# Patient Record
Sex: Female | Born: 1960 | Race: White | Hispanic: Yes | Marital: Single | Smoking: Former smoker
Health system: Southern US, Community
[De-identification: ages and names within clinical notes are randomized; demographics above are authoritative.]

## PROBLEM LIST (undated history)

## (undated) DIAGNOSIS — M329 Systemic lupus erythematosus, unspecified: Secondary | ICD-10-CM

## (undated) DIAGNOSIS — E119 Type 2 diabetes mellitus without complications: Secondary | ICD-10-CM

## (undated) DIAGNOSIS — E669 Obesity, unspecified: Secondary | ICD-10-CM

## (undated) DIAGNOSIS — I10 Essential (primary) hypertension: Secondary | ICD-10-CM

---

## 2006-07-16 ENCOUNTER — Ambulatory Visit: Payer: Self-pay | Admitting: Family Medicine

## 2008-08-31 ENCOUNTER — Ambulatory Visit: Payer: Self-pay | Admitting: Medical

## 2010-08-26 ENCOUNTER — Ambulatory Visit: Payer: Self-pay | Admitting: Family

## 2013-06-03 ENCOUNTER — Emergency Department: Payer: Self-pay | Admitting: Emergency Medicine

## 2013-06-03 LAB — COMPREHENSIVE METABOLIC PANEL
ALBUMIN: 3.1 g/dL — AB (ref 3.4–5.0)
ALK PHOS: 131 U/L — AB
ANION GAP: 6 — AB (ref 7–16)
AST: 39 U/L — AB (ref 15–37)
BILIRUBIN TOTAL: 0.3 mg/dL (ref 0.2–1.0)
BUN: 12 mg/dL (ref 7–18)
Calcium, Total: 8.7 mg/dL (ref 8.5–10.1)
Chloride: 106 mmol/L (ref 98–107)
Co2: 27 mmol/L (ref 21–32)
Creatinine: 0.9 mg/dL (ref 0.60–1.30)
EGFR (African American): >60
EGFR (Non-African Amer.): >60
Glucose: 164 mg/dL — ABNORMAL HIGH (ref 65–99)
OSMOLALITY: 281 (ref 275–301)
Potassium: 3.9 mmol/L (ref 3.5–5.1)
SGPT (ALT): 73 U/L (ref 12–78)
SODIUM: 139 mmol/L (ref 136–145)
Total Protein: 6.8 g/dL (ref 6.4–8.2)

## 2013-06-03 LAB — CBC
HCT: 39.3 % (ref 35.0–47.0)
HGB: 13.4 g/dL (ref 12.0–16.0)
MCH: 31.4 pg (ref 26.0–34.0)
MCHC: 34 g/dL (ref 32.0–36.0)
MCV: 92 fL (ref 80–100)
Platelet: 189 10*3/uL (ref 150–440)
RBC: 4.26 10*6/uL (ref 3.80–5.20)
RDW: 13.4 % (ref 11.5–14.5)
WBC: 5 10*3/uL (ref 3.6–11.0)

## 2013-06-03 LAB — TROPONIN I: Troponin-I: <0.02 ng/mL

## 2013-11-04 ENCOUNTER — Emergency Department: Payer: Self-pay | Admitting: Emergency Medicine

## 2014-09-29 ENCOUNTER — Other Ambulatory Visit: Payer: Self-pay | Admitting: Primary Care

## 2014-09-29 DIAGNOSIS — Z1231 Encounter for screening mammogram for malignant neoplasm of breast: Secondary | ICD-10-CM

## 2014-10-09 ENCOUNTER — Ambulatory Visit
Admission: RE | Admit: 2014-10-09 | Discharge: 2014-10-09 | Disposition: A | Discharge status: Home / Self Care | Payer: Self-pay | Source: Ambulatory Visit | Attending: Primary Care | Admitting: Primary Care

## 2014-10-09 DIAGNOSIS — Z1231 Encounter for screening mammogram for malignant neoplasm of breast: Secondary | ICD-10-CM

## 2014-10-14 ENCOUNTER — Other Ambulatory Visit: Payer: Self-pay | Admitting: Primary Care

## 2014-10-14 DIAGNOSIS — R928 Other abnormal and inconclusive findings on diagnostic imaging of breast: Secondary | ICD-10-CM

## 2014-10-20 ENCOUNTER — Ambulatory Visit
Admission: RE | Admit: 2014-10-20 | Discharge: 2014-10-20 | Disposition: A | Discharge status: Home / Self Care | Payer: Self-pay | Source: Ambulatory Visit | Attending: Primary Care | Admitting: Primary Care

## 2014-10-20 DIAGNOSIS — R928 Other abnormal and inconclusive findings on diagnostic imaging of breast: Secondary | ICD-10-CM

## 2014-10-20 DIAGNOSIS — N63 Unspecified lump in breast: Secondary | ICD-10-CM | POA: Insufficient documentation

## 2014-11-09 ENCOUNTER — Encounter: Payer: Self-pay | Admitting: *Deleted

## 2014-11-09 ENCOUNTER — Ambulatory Visit
Admission: RE | Admit: 2014-11-09 | Discharge: 2014-11-09 | Disposition: A | Discharge status: Home / Self Care | Payer: Self-pay | Source: Ambulatory Visit | Attending: Oncology | Admitting: Oncology

## 2014-11-09 ENCOUNTER — Ambulatory Visit: Payer: Self-pay | Attending: Oncology | Admitting: *Deleted

## 2014-11-09 VITALS — BP 130/84 | HR 84 | Temp 98.0°F | Resp 18 | Ht 68.11 in | Wt 269.8 lb

## 2014-11-09 DIAGNOSIS — N63 Unspecified lump in unspecified breast: Secondary | ICD-10-CM

## 2014-11-09 NOTE — Progress Notes (Signed)
Subjective:     Patient ID: Jennifer Bonilla, female   DOB: 05/04/1960, 54 y.o.   MRN: [id number]  HPI   Review of Systems     Objective:   Physical Exam  Pulmonary/Chest: Right breast exhibits tenderness. Right breast exhibits no inverted nipple, no mass, no nipple discharge and no skin change. Left breast exhibits tenderness. Left breast exhibits no inverted nipple, no mass, no nipple discharge and no skin change. Breasts are symmetrical.       Assessment:     54 year old Hispanic female [facility] for further evaluation of a right breast cyst.  Patient received a free mammogram via our [employer] on 10/20/14.  Results was a birads 3, noting a complicated right breast cyst and a left breast mass.  Radiologist recommended a right breast cyst aspiration.  Jennifer Bonilla, the interpreter present during the interview and exam.  Clinical breast with notable striae.  Bilateral breast are tender to palpation.  States she drinks one cup of coffee a day, and no other caffeine products.  Taught self breast awareness.  Patient has been screened for eligibility.  She does not have any insurance, Medicare or Medicaid.  She also meets financial eligibility.  Hand-out given on the Affordable Care Act.    Plan:      Order placed for a right breast ultrasound guided cyst aspiration.  Will schedule appropriate follow-up appointment when pathology is available.  Follow-up per protocol.

## 2014-11-11 ENCOUNTER — Ambulatory Visit: Payer: Self-pay

## 2014-11-25 ENCOUNTER — Encounter: Payer: Self-pay | Admitting: *Deleted

## 2014-11-25 NOTE — Progress Notes (Signed)
FNA completed on 11/09/14 with scant fluid removed.  No further work-up per radiology.  To follow-up in 1 year.

## 2015-10-20 ENCOUNTER — Encounter: Payer: Self-pay | Admitting: *Deleted

## 2015-10-20 ENCOUNTER — Ambulatory Visit: Payer: Self-pay | Attending: Internal Medicine | Admitting: *Deleted

## 2015-10-20 VITALS — BP 121/74 | HR 76 | Temp 97.2°F | Ht 67.72 in | Wt 277.2 lb

## 2015-10-20 DIAGNOSIS — N63 Unspecified lump in unspecified breast: Secondary | ICD-10-CM

## 2015-10-20 NOTE — Patient Instructions (Signed)
Gave patient hand-out, Women Staying Healthy, Active and Well from [facility], with education on breast health, pap smears, heart and colon health. 

## 2015-10-20 NOTE — Progress Notes (Signed)
Subjective:     Patient ID: Delane GingerMaria CRISTINA Grable, female   DOB: 08/16/1960, 55 y.o.   MRN: [id number]  HPI   Review of Systems     Objective:   Physical Exam  Pulmonary/Chest: Right breast exhibits tenderness. Right breast exhibits no inverted nipple, no mass, no nipple discharge and no skin change. Left breast exhibits tenderness. Left breast exhibits no inverted nipple, no mass, no nipple discharge and no skin change.    Right areola larger than the left       Assessment:     55 year old [place] [facility].  Had benign FNA of the left breast 2016.  Needs follow up of the right breast nodule from 2016.  Patient complains of targeted bilateral breast pain.  On clinical breast exam there is no dominant mass, skin changes or lymphadenopathy.  States the pain has been present on the right for about 4 months.  States she cannot sleep on her right side.  Does take Tylenol occasionally for the pain with good relief.  Taught self breast awareness.  Patient has been screened for eligibility.  She does not have any insurance, Medicare or Medicaid.  She also meets financial eligibility.  Hand-out given on the Affordable Care Act.     Plan:     Bilateral diagnostic mammogram ordered with ultrasound.  Will follow-up per protocol.

## 2015-10-29 ENCOUNTER — Ambulatory Visit
Admission: RE | Admit: 2015-10-29 | Discharge: 2015-10-29 | Disposition: A | Discharge status: Home / Self Care | Payer: Self-pay | Source: Ambulatory Visit | Attending: Internal Medicine | Admitting: Internal Medicine

## 2015-10-29 DIAGNOSIS — N63 Unspecified lump in unspecified breast: Secondary | ICD-10-CM

## 2015-11-11 ENCOUNTER — Telehealth: Payer: Self-pay | Admitting: *Deleted

## 2015-11-11 NOTE — Telephone Encounter (Signed)
Called patient via the [place] [phone] the language line interpreter.  Did reach the patient, but she stated she could not talk to call her back at 12:00.   I need to discuss her mammogram results and reassess her breast pain. Will try again later.

## 2015-12-10 ENCOUNTER — Telehealth: Payer: Self-pay | Admitting: *Deleted

## 2015-12-10 NOTE — Telephone Encounter (Signed)
Called patient via Reuel BoomDaniel, #[phone] the [employer] T[employer]interpreter,via [employer].  Patient would not talk with me.  Tried to ask if her breast pain had resolved.  States she didn't know what I was talking about.  Patient stated she would call me back.  This has been the second attempt to talk with the patient.  She had a normal mammogram, and her initial breast pain was relieved by Tylenol.  If patient returns my call I will offer further evaluation if pain persist, otherwise will go ahead and close case.  [facility] to [place].

## 2018-08-26 ENCOUNTER — Ambulatory Visit: Payer: Self-pay

## 2019-04-06 ENCOUNTER — Emergency Department: Payer: Self-pay

## 2019-04-06 ENCOUNTER — Other Ambulatory Visit: Payer: Self-pay

## 2019-04-06 ENCOUNTER — Emergency Department
Admission: EM | Admit: 2019-04-06 | Discharge: 2019-04-06 | Disposition: A | Discharge status: Home / Self Care | Payer: Self-pay | Attending: Emergency Medicine | Admitting: Emergency Medicine

## 2019-04-06 ENCOUNTER — Encounter: Payer: Self-pay | Admitting: Intensive Care

## 2019-04-06 DIAGNOSIS — M545 Low back pain, unspecified: Secondary | ICD-10-CM

## 2019-04-06 DIAGNOSIS — Z87891 Personal history of nicotine dependence: Secondary | ICD-10-CM | POA: Insufficient documentation

## 2019-04-06 DIAGNOSIS — E119 Type 2 diabetes mellitus without complications: Secondary | ICD-10-CM | POA: Insufficient documentation

## 2019-04-06 DIAGNOSIS — Z7984 Long term (current) use of oral hypoglycemic drugs: Secondary | ICD-10-CM | POA: Insufficient documentation

## 2019-04-06 DIAGNOSIS — I10 Essential (primary) hypertension: Secondary | ICD-10-CM | POA: Insufficient documentation

## 2019-04-06 HISTORY — DX: Essential (primary) hypertension: I10

## 2019-04-06 HISTORY — DX: Type 2 diabetes mellitus without complications: E11.9

## 2019-04-06 HISTORY — DX: Systemic lupus erythematosus, unspecified: M32.9

## 2019-04-06 HISTORY — DX: Obesity, unspecified: E66.9

## 2019-04-06 MED ORDER — METFORMIN HCL 1000 MG PO TABS: 1000.0000 mg | ORAL_TABLET | Freq: Two times a day (BID) | ORAL | 0 refills | Status: AC

## 2019-04-06 MED ORDER — IBUPROFEN 800 MG PO TABS
800.0000 mg | ORAL_TABLET | Freq: Once | ORAL | Status: AC
  Administered 2019-04-06: 800 mg via ORAL
  Filled 2019-04-06: qty 1

## 2019-04-06 MED ORDER — TRAMADOL HCL 50 MG PO TABS: 50.0000 mg | ORAL_TABLET | Freq: Three times a day (TID) | ORAL | 0 refills | Status: AC | PRN

## 2019-04-06 MED ORDER — METHOCARBAMOL 500 MG PO TABS: 500.0000 mg | ORAL_TABLET | Freq: Three times a day (TID) | ORAL | 0 refills | Status: AC | PRN

## 2019-04-06 MED ORDER — TRAMADOL HCL 50 MG PO TABS
50.0000 mg | ORAL_TABLET | Freq: Once | ORAL | Status: AC
  Administered 2019-04-06: 50 mg via ORAL
  Filled 2019-04-06: qty 1

## 2019-04-06 MED ORDER — CYCLOBENZAPRINE HCL 10 MG PO TABS
10.0000 mg | ORAL_TABLET | Freq: Once | ORAL | Status: AC
  Administered 2019-04-06: 10 mg via ORAL
  Filled 2019-04-06: qty 1

## 2019-04-06 NOTE — ED Notes (Signed)
First Nurse Note: Pt to ED via POV c/o back pain. Pt is in NAD at this time.

## 2019-04-06 NOTE — ED Triage Notes (Signed)
Patient c/o lower back pain. Denies urinary symptoms. Reports some nausea

## 2019-04-06 NOTE — ED Provider Notes (Signed)
[facility] Emergency Department Provider Note ____________________________________________  Time seen: 1915  I have reviewed the triage vital signs and the nursing notes.  HISTORY  Chief Complaint  Back Pain   HPI Jennifer Bonilla is a 58 y.o. female presents to the ER today with complaint of left lower back pain.  She reports this started 3 to 4 days ago.  She describes the pain as sharp and stabbing.  The pain radiates into her left buttock but she denies pain that radiates down her legs.  She denies numbness, tingling, weakness or loss of bowel or bladder control.  She denies any injury to the area.  She has taken Tylenol OTC with minimal relief.  She denies prior back surgery.  She would also like a refill of her diabetic medication today. She has an appt on 12/20 with her PCP. Past Medical History:  Diagnosis Date  . Diabetes mellitus without complication ([place])   . Hypertension   . Obese   . Systemic lupus ([place])     There are no active problems to display for this patient.   History reviewed. No pertinent surgical history.  Prior to Admission medications   Medication Sig Start Date End Date Taking? Authorizing Provider  metFORMIN (GLUCOPHAGE) 1000 MG tablet Take 1 tablet (1,000 mg total) by mouth 2 (two) times daily with a meal. 04/06/19 04/05/20  Jearld Fenton, NP  methocarbamol (ROBAXIN) 500 MG tablet Take 1 tablet (500 mg total) by mouth every 8 (eight) hours as needed for muscle spasms. 04/06/19   Jearld Fenton, NP  traMADol (ULTRAM) 50 MG tablet Take 1 tablet (50 mg total) by mouth every 8 (eight) hours as needed. 04/06/19 04/05/20  Jearld Fenton, NP    Allergies Patient has no known allergies.  History reviewed. No pertinent family history.  Social History Social History   Tobacco Use  . Smoking status: Former Smoker    Packs/day: 1.00    Years: 10.00    Pack years: 10.00    Types: Cigarettes    Quit date: 11/08/1993    Years  since quitting: 25.4  . Smokeless tobacco: Never Used  Substance Use Topics  . Alcohol use: Yes    Alcohol/week: 0.0 standard drinks    Comment: rare  . Drug use: Never    Review of Systems  Constitutional: Negative for fever, chills or body aches. Cardiovascular: Negative for chest pain or chest tightness. Respiratory: Negative for cough or shortness of breath. Gastrointestinal: Negative for loss of bowel control. Genitourinary: Negative for loss of bladder control. Musculoskeletal: Positive for left lower back pain.  Negative for hip pain. Skin: Negative for rash. Neurological: Negative for focal weakness, tingling or numbness. ____________________________________________  PHYSICAL EXAM:  VITAL SIGNS: ED Triage Vitals  Enc Vitals Group     BP 04/06/19 1824 (!) 174/80     Pulse Rate 04/06/19 1824 77     Resp 04/06/19 1824 18     Temp 04/06/19 1824 98.1 F (36.7 C)     Temp Source 04/06/19 1824 Oral     SpO2 04/06/19 1824 99 %     Weight 04/06/19 1834 263 lb (119.3 kg)     Height 04/06/19 1834 5' 3.78" (1.62 m)     Head Circumference --      Peak Flow --      Pain Score 04/06/19 1833 6     Pain Loc --      Pain Edu? --  Excl. in GC? --     Constitutional: Alert and oriented.  Obese but in no distress. Cardiovascular: Normal rate, regular rhythm.  Pedal pulses 2+ bilaterally. Respiratory: Normal respiratory effort. No wheezes/rales/rhonchi. Gastrointestinal: Soft and nontender. No CVA tenderness. Musculoskeletal: Normal flexion, extension and rotation of the spine.  No bony tenderness noted over the spine.  Pain with palpation of the left paralumbar muscles.  Strength 5/5 BLE.  No difficulty with gait. Neurologic:   Normal speech and language. No gross focal neurologic deficits are appreciated. Skin:  Skin is warm, dry and intact.  ____________________________________________   RADIOLOGY  Imaging Orders     DG Lumbar Spine 2-3 Views IMPRESSION:  No acute  abnormality detected.    ____________________________________________    INITIAL IMPRESSION / ASSESSMENT AND PLAN / ED COURSE  Acute Left Sided Low Back Pain:  Xray negative Ibuprofen 800 mg PO x 1 Flexeril 10 mg PO x 1 Tramadol 50 mg PO x 1 RX for Tramadol 50 mg TID prn RX for Robaxin 500 mg TID prn- sedation caution given Back exercises given Encouraged heat and massage  DM 2:  Metformin 1000 mg BID refilled today #60, 0 refills Follow up with PCP     I reviewed the patient's prescription history over the last 12 months in the multi-state controlled substances database(s) that includes [place], [place], [place], [place], [place], [place], [place], [place], [place], [place], [place], [place], [place], and [place].  Results were notable for no recent controlled substances. ____________________________________________  FINAL CLINICAL IMPRESSION(S) / ED DIAGNOSES  Final diagnoses:  Acute left-sided low back pain without sciatica  Type 2 diabetes mellitus without complication, with long-term current use of insulin [facility])   Nicki Reaper, NP    Lorre Munroe, NP 04/06/19 2037    Dionne Bucy, MD 04/07/19 0025

## 2019-04-06 NOTE — ED Notes (Signed)
Patient transported to X-ray 

## 2019-04-06 NOTE — Discharge Instructions (Addendum)
You were seen today for acute left-sided low back pain.  Your x-ray was negative.  I have given you prescriptions for pain medication and muscle relaxers.  These may cause sedation.  I have given you back exercises to try to help relieve some of your pain.  I have also refilled your Metformin today.  Please follow-up with your PCP if symptoms persist or worsen.

## 2019-04-06 NOTE — ED Notes (Signed)
meds given. Pt ambulatory to the bathroom.

## 2019-07-20 ENCOUNTER — Ambulatory Visit: Payer: Self-pay | Attending: Internal Medicine

## 2019-07-20 DIAGNOSIS — Z23 Encounter for immunization: Secondary | ICD-10-CM

## 2019-07-20 NOTE — Progress Notes (Signed)
   Covid-19 Vaccination Clinic  Name:  Jennifer Bonilla    MRN: [id number] DOB: October 31, 1960  07/20/2019  Jennifer Bonilla was observed post Covid-19 immunization for 15 minutes without incident. She was provided with Vaccine Information Sheet and instruction to access the V-Safe system.   Jennifer Bonilla was instructed to call 911 with any severe reactions post vaccine: Marland Kitchen Difficulty breathing  . Swelling of face and throat  . A fast heartbeat  . A bad rash all over body  . Dizziness and weakness   Immunizations Administered    Name Date Dose VIS Date Route   Pfizer COVID-19 Vaccine 07/20/2019 12:09 PM 0.3 mL 04/11/2019 Intramuscular   Manufacturer: [employer], [employer]   Lot: [id number]   NDC: [address]-8

## 2019-08-10 ENCOUNTER — Ambulatory Visit: Payer: Self-pay | Attending: Internal Medicine

## 2019-08-10 DIAGNOSIS — Z23 Encounter for immunization: Secondary | ICD-10-CM

## 2019-08-10 NOTE — Progress Notes (Signed)
   Covid-19 Vaccination Clinic  Name:  MORELIA CASSELLS    MRN: [id number] DOB: 03-10-1961  08/10/2019  Ms. Schirm was observed post Covid-19 immunization for 15 minutes without incident. She was provided with Vaccine Information Sheet and instruction to access the V-Safe system.   Ms. Grothe was instructed to call 911 with any severe reactions post vaccine: Marland Kitchen Difficulty breathing  . Swelling of face and throat  . A fast heartbeat  . A bad rash all over body  . Dizziness and weakness   Immunizations Administered    Name Date Dose VIS Date Route   Pfizer COVID-19 Vaccine 08/10/2019 11:45 AM 0.3 mL 04/11/2019 Intramuscular   Manufacturer: [employer], [employer]   Lot: [id number]   NDC: [address]-0

## 2019-12-02 ENCOUNTER — Other Ambulatory Visit: Payer: Self-pay | Admitting: Primary Care

## 2019-12-02 DIAGNOSIS — R1012 Left upper quadrant pain: Secondary | ICD-10-CM

## 2019-12-16 ENCOUNTER — Ambulatory Visit: Payer: Self-pay | Attending: Oncology

## 2019-12-22 ENCOUNTER — Ambulatory Visit (LOCAL_COMMUNITY_HEALTH_CENTER): Payer: Self-pay

## 2019-12-22 ENCOUNTER — Other Ambulatory Visit: Payer: Self-pay

## 2019-12-22 DIAGNOSIS — Z23 Encounter for immunization: Secondary | ICD-10-CM

## 2019-12-22 NOTE — Progress Notes (Signed)
In Nurse Clinic requesting MMR as requirement for immigration. Fax received from immigration clinic in [place], Dr. Jill Poling requesting MMR for pt. MMR given without difficulty today. Updated [facility] copy given and explained to pt. Pt to call immigration clinic to see if 2nd MMR is needed. Dot Lanes acted as interpreter today. Josie Saunders, RN

## 2021-07-06 IMAGING — CR DG LUMBAR SPINE 2-3V
3 series · 3 of 3 positions shown · non-contrast
Comparison: November 04, 2013

CLINICAL DATA: Back pain

EXAM:
LUMBAR SPINE - 2-3 VIEW

[l-spine ap]
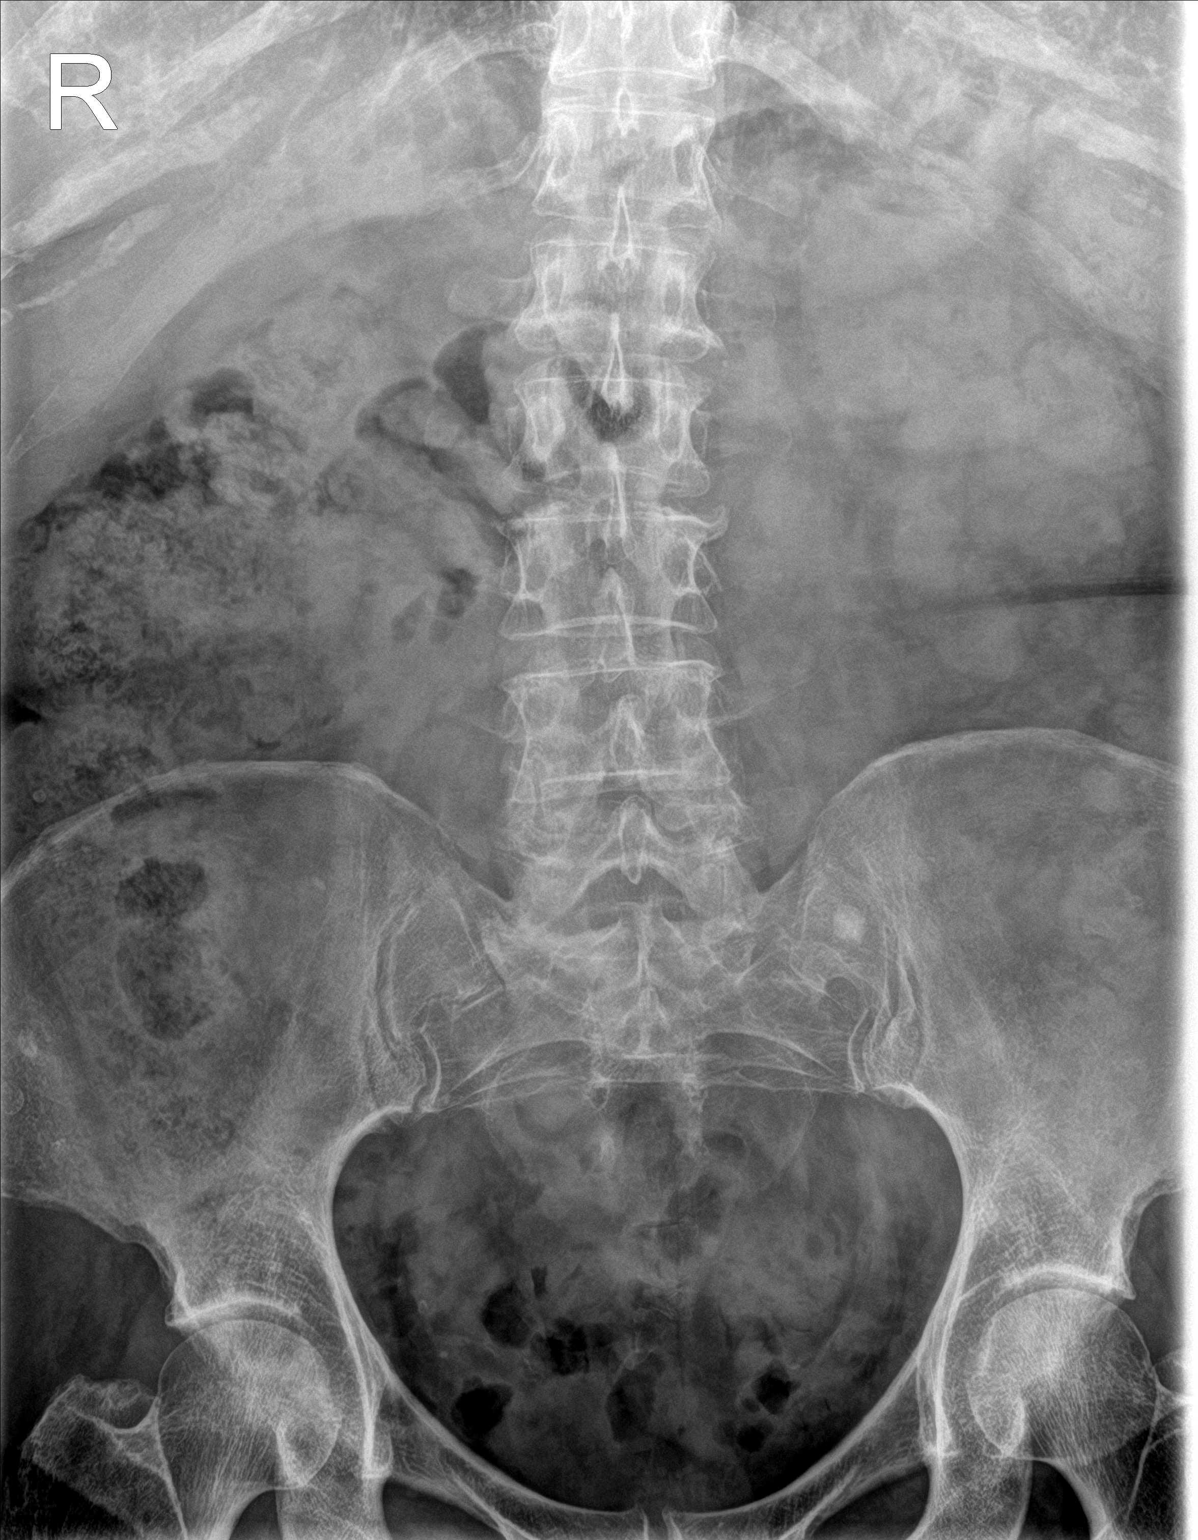

[l-spine lat]
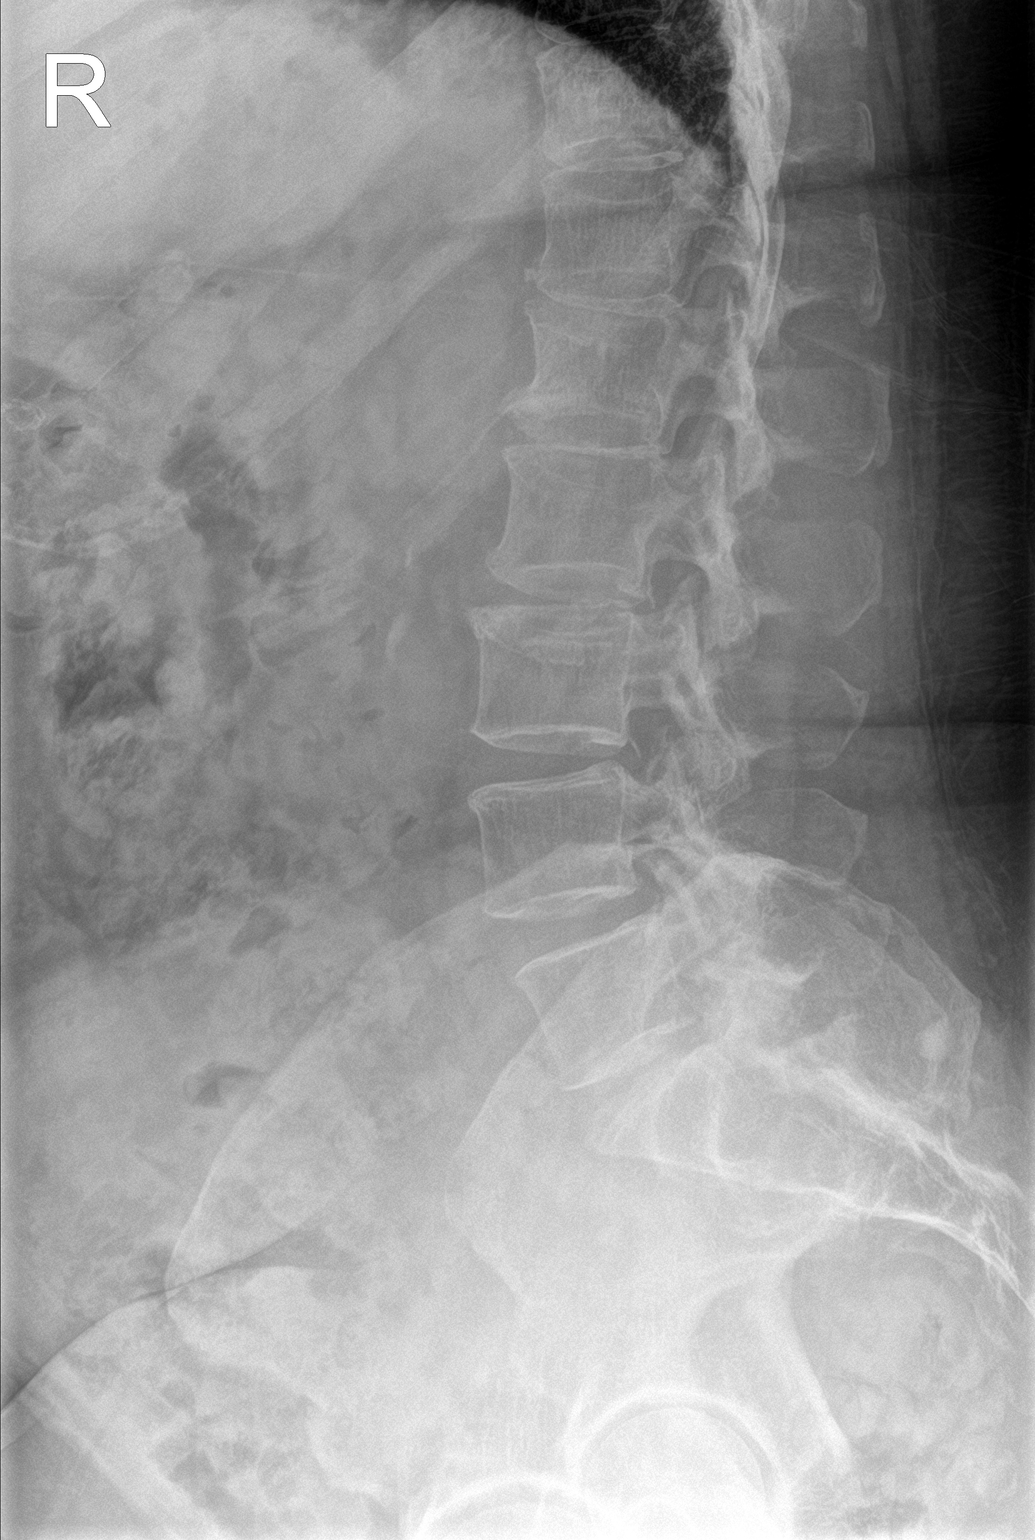

[l-spine spot]
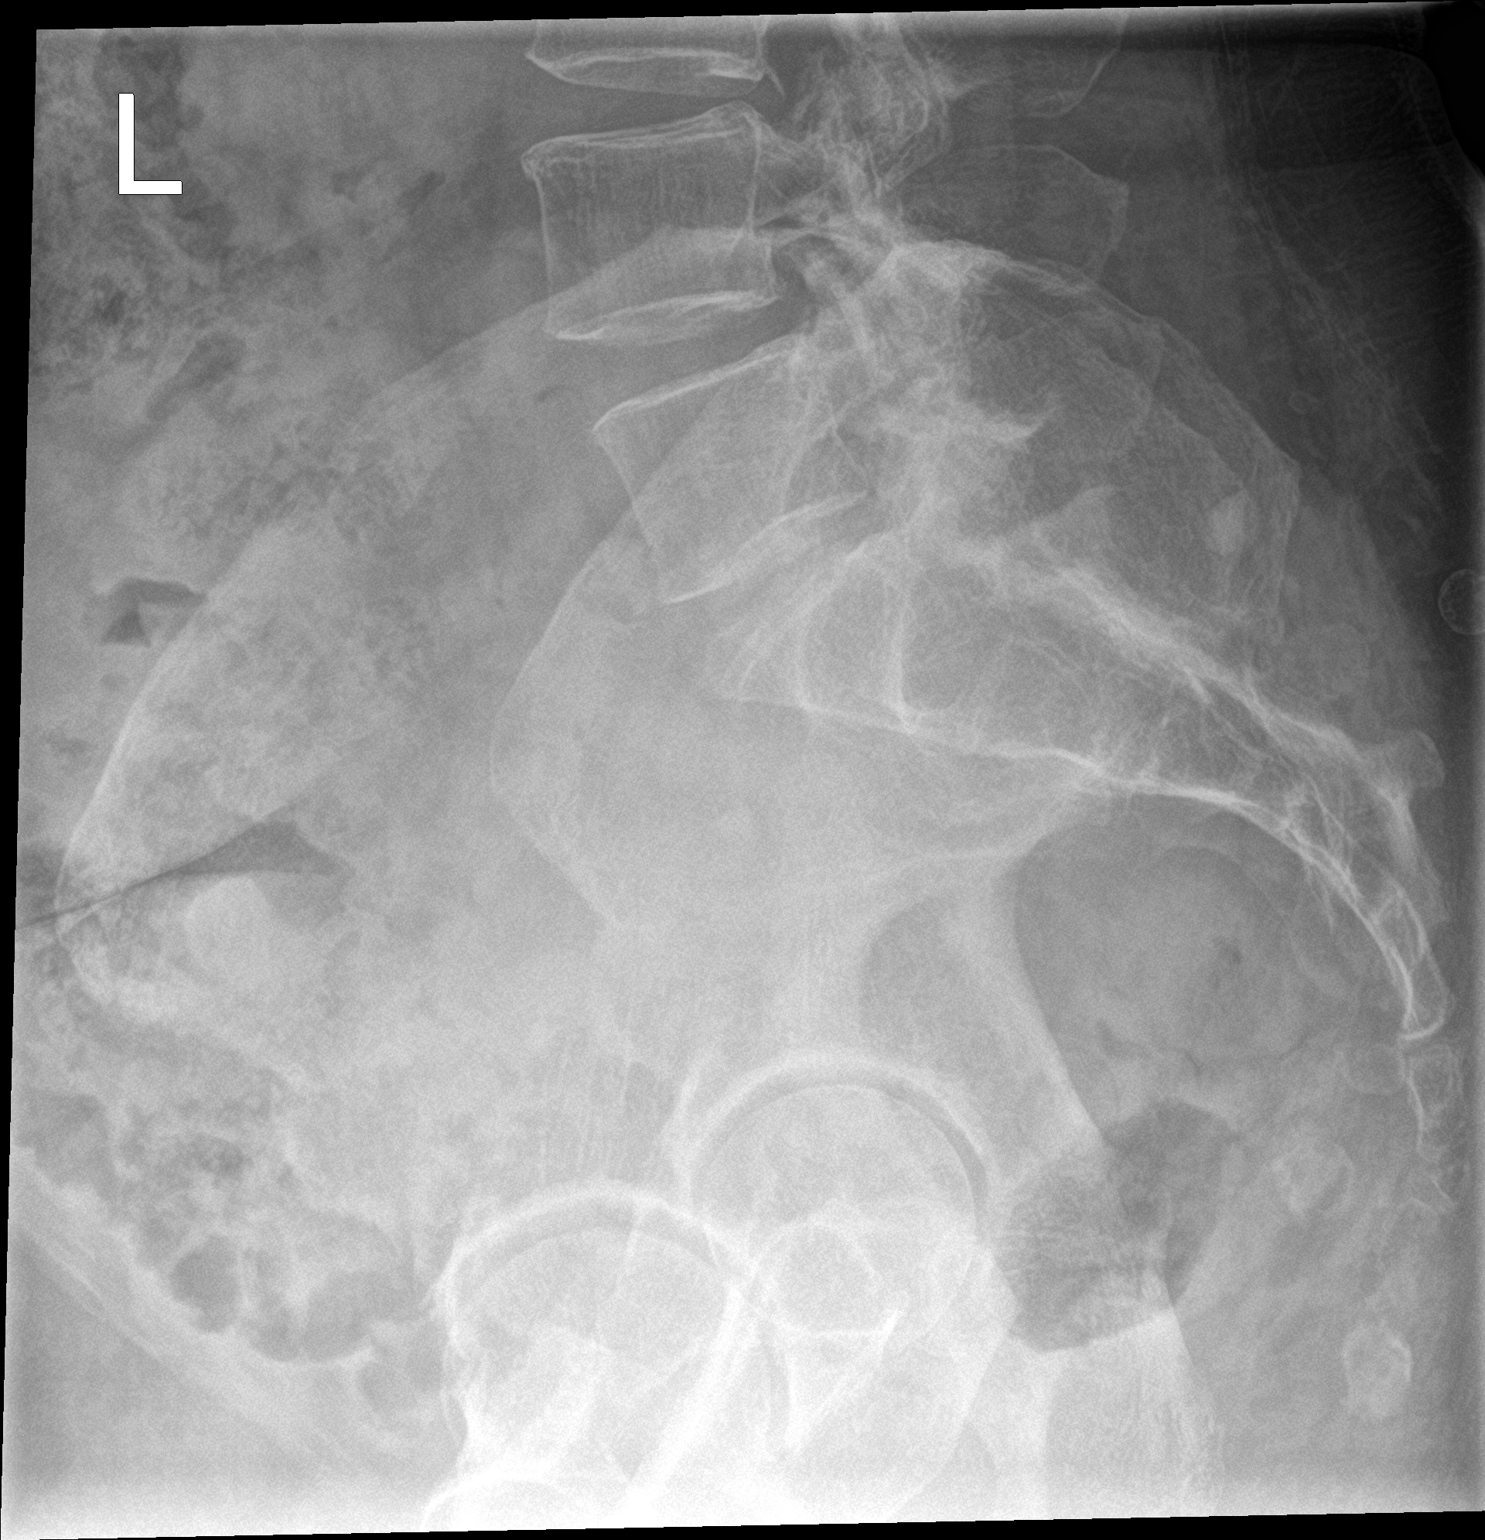

[3 of 3 positions shown; findings below may reference images not displayed]

FINDINGS: There is a 1.1 cm density projecting over the left hemi sacrum that
was not appreciated on prior lumbar radiographs from 2502. This is
visualized posterior to the osseous structures is favored to
represent a soft tissue calcification. Multilevel degenerative
changes are noted throughout the visualized thoracolumbar spine.
There is a grade 1 anterolisthesis of L4 on L5, presumably
degenerative in etiology. There is no displaced fracture. A moderate
amount of stool is noted in the colon. There are no definite
radiopaque kidney stones.
IMPRESSION: No acute abnormality detected.

## 2021-08-12 ENCOUNTER — Other Ambulatory Visit (HOSPITAL_COMMUNITY): Payer: Self-pay | Admitting: Primary Care

## 2021-08-12 ENCOUNTER — Other Ambulatory Visit: Payer: Self-pay | Admitting: Primary Care

## 2021-08-12 DIAGNOSIS — I739 Peripheral vascular disease, unspecified: Secondary | ICD-10-CM

## 2021-08-16 ENCOUNTER — Other Ambulatory Visit: Payer: Self-pay | Admitting: Primary Care

## 2021-08-16 DIAGNOSIS — I739 Peripheral vascular disease, unspecified: Secondary | ICD-10-CM

## 2021-08-25 ENCOUNTER — Other Ambulatory Visit: Payer: Self-pay | Admitting: Primary Care

## 2021-08-25 DIAGNOSIS — L819 Disorder of pigmentation, unspecified: Secondary | ICD-10-CM

## 2021-08-25 DIAGNOSIS — I739 Peripheral vascular disease, unspecified: Secondary | ICD-10-CM

## 2021-08-25 DIAGNOSIS — R209 Unspecified disturbances of skin sensation: Secondary | ICD-10-CM

## 2021-08-26 ENCOUNTER — Ambulatory Visit: Admission: RE | Admit: 2021-08-26 | Payer: BLUE CROSS/BLUE SHIELD | Source: Ambulatory Visit

## 2022-01-25 ENCOUNTER — Other Ambulatory Visit: Payer: Self-pay | Admitting: Family Medicine

## 2022-01-25 DIAGNOSIS — N63 Unspecified lump in unspecified breast: Secondary | ICD-10-CM

## 2022-02-14 ENCOUNTER — Ambulatory Visit: Admission: RE | Admit: 2022-02-14 | Payer: BLUE CROSS/BLUE SHIELD | Source: Ambulatory Visit

## 2022-02-14 ENCOUNTER — Ambulatory Visit: Payer: BLUE CROSS/BLUE SHIELD
